# Patient Record
Sex: Male | Born: 1975 | Race: Black or African American | Hispanic: No | Marital: Married | Smoking: Never smoker
Health system: Southern US, Community
[De-identification: ages and names within clinical notes are randomized; demographics above are authoritative.]

---

## 2019-02-27 ENCOUNTER — Other Ambulatory Visit: Payer: Self-pay

## 2019-02-27 DIAGNOSIS — Z20822 Contact with and (suspected) exposure to covid-19: Secondary | ICD-10-CM

## 2019-02-28 LAB — NOVEL CORONAVIRUS, NAA: SARS-CoV-2, NAA: DETECTED — AB

## 2019-03-08 ENCOUNTER — Emergency Department
Admission: EM | Admit: 2019-03-08 | Discharge: 2019-03-08 | Disposition: A | Discharge status: Home / Self Care | Payer: 59 | Attending: Student | Admitting: Student

## 2019-03-08 ENCOUNTER — Encounter: Payer: Self-pay | Admitting: Emergency Medicine

## 2019-03-08 ENCOUNTER — Other Ambulatory Visit: Payer: Self-pay

## 2019-03-08 ENCOUNTER — Emergency Department: Payer: 59

## 2019-03-08 DIAGNOSIS — Y93I9 Activity, other involving external motion: Secondary | ICD-10-CM | POA: Insufficient documentation

## 2019-03-08 DIAGNOSIS — Y999 Unspecified external cause status: Secondary | ICD-10-CM | POA: Diagnosis not present

## 2019-03-08 DIAGNOSIS — M545 Low back pain: Secondary | ICD-10-CM | POA: Diagnosis not present

## 2019-03-08 DIAGNOSIS — M7918 Myalgia, other site: Secondary | ICD-10-CM

## 2019-03-08 DIAGNOSIS — M542 Cervicalgia: Secondary | ICD-10-CM | POA: Insufficient documentation

## 2019-03-08 DIAGNOSIS — Y92410 Unspecified street and highway as the place of occurrence of the external cause: Secondary | ICD-10-CM | POA: Insufficient documentation

## 2019-03-08 MED ORDER — CYCLOBENZAPRINE HCL 5 MG PO TABS: 5.0000 mg | ORAL_TABLET | Freq: Three times a day (TID) | ORAL | 0 refills | Status: DC | PRN

## 2019-03-08 MED ORDER — IBUPROFEN 800 MG PO TABS: 800.0000 mg | ORAL_TABLET | Freq: Three times a day (TID) | ORAL | 0 refills | Status: DC | PRN
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
## 2019-06-18 ENCOUNTER — Ambulatory Visit: Payer: Self-pay | Attending: Internal Medicine

## 2019-06-18 DIAGNOSIS — Z20822 Contact with and (suspected) exposure to covid-19: Secondary | ICD-10-CM

## 2019-06-19 LAB — NOVEL CORONAVIRUS, NAA: SARS-CoV-2, NAA: NOT DETECTED

## 2020-02-20 ENCOUNTER — Emergency Department
Admission: EM | Admit: 2020-02-20 | Discharge: 2020-02-20 | Disposition: A | Discharge status: Home / Self Care | Payer: BC Managed Care – PPO | Attending: Emergency Medicine | Admitting: Emergency Medicine

## 2020-02-20 ENCOUNTER — Other Ambulatory Visit: Payer: Self-pay

## 2020-02-20 DIAGNOSIS — M25511 Pain in right shoulder: Secondary | ICD-10-CM | POA: Insufficient documentation

## 2020-02-20 MED ORDER — BACLOFEN 10 MG PO TABS: 10.0000 mg | ORAL_TABLET | Freq: Three times a day (TID) | ORAL | 1 refills | Status: AC
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
## 2020-02-20 NOTE — Discharge Instructions (Signed)
Follow-up with orthopedics if not improving in 5 to 7 days.  Return emergency department worsening.  Apply ice 3 times daily for approximately 15 minutes to the right shoulder.  This will help decrease inflammation.  Take the new muscle relaxer during the day and to the Flexeril at night.

## 2020-02-20 NOTE — ED Notes (Signed)
Patient declined discharge vital signs. 
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
## 2021-02-08 ENCOUNTER — Emergency Department
Admission: EM | Admit: 2021-02-08 | Discharge: 2021-02-08 | Disposition: A | Discharge status: Home / Self Care | Payer: Managed Care, Other (non HMO) | Attending: Emergency Medicine | Admitting: Emergency Medicine

## 2021-02-08 ENCOUNTER — Other Ambulatory Visit: Payer: Self-pay

## 2021-02-08 DIAGNOSIS — M5416 Radiculopathy, lumbar region: Secondary | ICD-10-CM | POA: Insufficient documentation

## 2021-02-08 DIAGNOSIS — M545 Low back pain, unspecified: Secondary | ICD-10-CM | POA: Diagnosis present

## 2021-02-08 MED ORDER — KETOROLAC TROMETHAMINE 30 MG/ML IJ SOLN
30.0000 mg | Freq: Once | INTRAMUSCULAR | Status: AC
  Administered 2021-02-08: 30 mg via INTRAMUSCULAR
  Filled 2021-02-08: qty 1

## 2021-02-08 MED ORDER — PREDNISONE 10 MG (21) PO TBPK: ORAL_TABLET | ORAL | 0 refills | Status: AC

## 2021-02-08 NOTE — Discharge Instructions (Addendum)
Take tapered steroid as directed.  

## 2021-02-08 NOTE — ED Triage Notes (Signed)
Pt to ED for pain from lower back to left leg. States has been ongoing for 2 weeks, thinks has pinched nerve, took muscle relaxer PTA

## 2021-02-08 NOTE — ED Provider Notes (Signed)
[facility]-EMERGENCY DEPARTMENT  ____________________________________________  Time seen: Approximately 10:49 PM  I have reviewed the triage vital signs and the nursing notes.   HISTORY  Chief Complaint Leg Pain   Historian Patient     HPI Kristopher Alvarez is a 45 y.o. male presents to the emergency department with left-sided low back pain that radiates along the lateral aspect of the left lower extremity.  Patient reports that pain started approximately 2 weeks ago.  Patient has been engaging in heavy lifting as he power lifts.  He denies bowel or bladder incontinence or saddle anesthesia.  He has been able to ambulate at home.  Patient reports that he had to leave work early due to pain.  No dysuria, hematuria or increased urinary frequency.   History reviewed. No pertinent past medical history.   Immunizations up to date:  Yes.     History reviewed. No pertinent past medical history.  There are no problems to display for this patient.   History reviewed. No pertinent surgical history.  Prior to Admission medications   Medication Sig Start Date End Date Taking? Authorizing Provider  predniSONE (STERAPRED UNI-PAK 21 TAB) 10 MG (21) TBPK tablet 6,6,5,5,4,4,3,3,2,2,1,1 02/08/21  Yes Pia Mau M, PA-C    Allergies Patient has no known allergies.  No family history on file.  Social History Social History   Tobacco Use   Smoking status: Never   Smokeless tobacco: Never  Substance Use Topics   Alcohol use: Yes    Comment: social    Drug use: Not Currently     Review of Systems  Constitutional: No fever/chills Eyes:  No discharge ENT: No upper respiratory complaints. Respiratory: no cough. No SOB/ use of accessory muscles to breath Gastrointestinal:   No nausea, no vomiting.  No diarrhea.  No constipation. Musculoskeletal: Patient has low back pain.  Skin: Negative for rash, abrasions, lacerations,  ecchymosis.   ____________________________________________   PHYSICAL EXAM:  VITAL SIGNS: ED Triage Vitals [02/08/21 1541]  Enc Vitals Group     BP (!) 122/91     Pulse Rate 81     Resp 16     Temp 98.4 F (36.9 C)     Temp Source Oral     SpO2 99 %     Weight 214 lb (97.1 kg)     Height 5\' 9"  (1.753 m)     Head Circumference      Peak Flow      Pain Score 10     Pain Loc      Pain Edu?      Excl. in [place]?      Constitutional: Alert and oriented. Well appearing and in no acute distress. Eyes: Conjunctivae are normal. PERRL. EOMI. Head: Atraumatic. ENT:  Cardiovascular: Normal rate, regular rhythm. Normal S1 and S2.  Good peripheral circulation. Respiratory: Normal respiratory effort without tachypnea or retractions. Lungs CTAB. Good air entry to the bases with no decreased or absent breath sounds Gastrointestinal: Bowel sounds x 4 quadrants. Soft and nontender to palpation. No guarding or rigidity. No distention. Musculoskeletal: Full range of motion to all extremities. No obvious deformities noted.  Patient has positive straight leg raise test on the left. Neurologic:  Normal for age. No gross focal neurologic deficits are appreciated.  Skin:  Skin is warm, dry and intact. No rash noted. Psychiatric: Mood and affect are normal for age. Speech and behavior are normal.   ____________________________________________   LABS (all labs ordered are listed, but only abnormal results  are displayed)  Labs Reviewed - No data to display ____________________________________________  EKG   ____________________________________________  RADIOLOGY   No results found.  ____________________________________________    PROCEDURES  Procedure(s) performed:     Procedures     Medications  ketorolac (TORADOL) 30 MG/ML injection 30 mg (30 mg Intramuscular Given 02/08/21 1659)     ____________________________________________   INITIAL IMPRESSION / ASSESSMENT AND  PLAN / ED COURSE  Pertinent labs & imaging results that were available during my care of the patient were reviewed by me and considered in my medical decision making (see chart for details).      Assessment and plan Lumbar radiculopathy 45 year old male presents to the emergency department with low back pain that radiates along the posterior lateral aspect of the left lower extremity.  Patient was given an injection of Toradol in the emergency department and discharged with a 2-week taper of prednisone.  Patient has muscle relaxer at home.  Return precautions were given to return with new or worsening symptoms.  All patient questions were answered.     ____________________________________________  FINAL CLINICAL IMPRESSION(S) / ED DIAGNOSES  Final diagnoses:  Lumbar radiculopathy      NEW MEDICATIONS STARTED DURING THIS VISIT:  ED Discharge Orders          Ordered    predniSONE (STERAPRED UNI-PAK 21 TAB) 10 MG (21) TBPK tablet        02/08/21 1647                This chart was dictated using voice recognition software/Dragon. Despite best efforts to proofread, errors can occur which can change the meaning. Any change was purely unintentional.     Orvil Feil, PA-C 02/08/21 2251    Delton Prairie, MD 02/11/21 1525

## 2021-02-08 NOTE — ED Notes (Signed)
See triage note  Presents with pain to lower back  States pain started about 2 weeks ago then eased off but then returned couple of days ago   Denies any specific injury  State [place] is moving into left leg
